# Patient Record
Sex: Male | Born: 1990 | Race: Black or African American | Hispanic: No | Marital: Single | Smoking: Heavy tobacco smoker
Health system: Southern US, Community
[De-identification: ages and names within clinical notes are randomized; demographics above are authoritative.]

---
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
## 2012-03-17 ENCOUNTER — Emergency Department: Payer: Self-pay | Admitting: Emergency Medicine

## 2012-03-24 ENCOUNTER — Emergency Department: Payer: Self-pay | Admitting: Emergency Medicine

## 2014-08-06 ENCOUNTER — Emergency Department
Admission: EM | Admit: 2014-08-06 | Discharge: 2014-08-06 | Disposition: A | Discharge status: Other Acute Inpatient Hospital | Payer: Self-pay | Attending: Emergency Medicine | Admitting: Emergency Medicine

## 2014-08-06 ENCOUNTER — Encounter: Payer: Self-pay | Admitting: Emergency Medicine

## 2014-08-06 ENCOUNTER — Emergency Department: Payer: Self-pay

## 2014-08-06 DIAGNOSIS — S02609A Fracture of mandible, unspecified, initial encounter for closed fracture: Secondary | ICD-10-CM | POA: Insufficient documentation

## 2014-08-06 DIAGNOSIS — Y9389 Activity, other specified: Secondary | ICD-10-CM | POA: Insufficient documentation

## 2014-08-06 DIAGNOSIS — Y9289 Other specified places as the place of occurrence of the external cause: Secondary | ICD-10-CM | POA: Insufficient documentation

## 2014-08-06 DIAGNOSIS — Y998 Other external cause status: Secondary | ICD-10-CM | POA: Insufficient documentation

## 2014-08-06 DIAGNOSIS — Z720 Tobacco use: Secondary | ICD-10-CM | POA: Insufficient documentation

## 2014-08-06 MED ORDER — OXYCODONE-ACETAMINOPHEN 5-325 MG PO TABS
1.0000 | ORAL_TABLET | Freq: Once | ORAL | Status: AC
  Administered 2014-08-06: 1 via ORAL
  Filled 2014-08-06: qty 1
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
## 2014-08-06 NOTE — Discharge Instructions (Signed)
No driving to [facility] due to taking Percocet prior to departure. Go to to [facility] ER, Doctor Yetta Barre is the accepting ER Doctor. Doctor Liana Gerold is the Consulting Doctor.
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
## 2014-08-06 NOTE — ED Provider Notes (Signed)
Medical screening examination/treatment/procedure(s) were conducted as a shared visit with non-physician practitioner(s) and myself.  I personally evaluated the patient during the encounter.  ----------------------------------------- 4:31 PM on 08/06/2014 -----------------------------------------  Patient is awake and alert with stable vital signs. I discussed with the patient and his family plan for transfer to [facility] for evaluation for his jaw fracture which she is agreeable with. The patient does not wish to go by ambulance though I did offer this to him, he has family who will drive him to the [facility] ER after leaving here today for further evaluation.  I did recommend to him and discussed that we certainly offer transport directly to the [facility] ER, but he and family will drive him there. He is fully awake and alert and in no distress. I've examined him personally and he has clear lungs is fully alert in no distress.  We will discharge the patient and his family stating him directly to [facility] for further evaluation regarding his jaw fracture.  Sharyn Creamer, MD 08/06/14 [phone]

## 2014-10-14 ENCOUNTER — Emergency Department: Payer: Self-pay

## 2014-10-14 ENCOUNTER — Emergency Department
Admission: EM | Admit: 2014-10-14 | Discharge: 2014-10-14 | Disposition: A | Discharge status: Other Acute Inpatient Hospital | Payer: Self-pay | Attending: Emergency Medicine | Admitting: Emergency Medicine

## 2014-10-14 ENCOUNTER — Encounter: Payer: Self-pay | Admitting: Emergency Medicine

## 2014-10-14 DIAGNOSIS — S66822A Laceration of other specified muscles, fascia and tendons at wrist and hand level, left hand, initial encounter: Secondary | ICD-10-CM | POA: Insufficient documentation

## 2014-10-14 DIAGNOSIS — Z046 Encounter for general psychiatric examination, requested by authority: Secondary | ICD-10-CM

## 2014-10-14 DIAGNOSIS — IMO0002 Reserved for concepts with insufficient information to code with codable children: Secondary | ICD-10-CM

## 2014-10-14 DIAGNOSIS — F1290 Cannabis use, unspecified, uncomplicated: Secondary | ICD-10-CM | POA: Insufficient documentation

## 2014-10-14 DIAGNOSIS — F10920 Alcohol use, unspecified with intoxication, uncomplicated: Secondary | ICD-10-CM

## 2014-10-14 DIAGNOSIS — Y9389 Activity, other specified: Secondary | ICD-10-CM | POA: Insufficient documentation

## 2014-10-14 DIAGNOSIS — Z7289 Other problems related to lifestyle: Secondary | ICD-10-CM

## 2014-10-14 DIAGNOSIS — Y998 Other external cause status: Secondary | ICD-10-CM | POA: Insufficient documentation

## 2014-10-14 DIAGNOSIS — F10120 Alcohol abuse with intoxication, uncomplicated: Secondary | ICD-10-CM | POA: Insufficient documentation

## 2014-10-14 DIAGNOSIS — X780XXA Intentional self-harm by sharp glass, initial encounter: Secondary | ICD-10-CM | POA: Insufficient documentation

## 2014-10-14 DIAGNOSIS — Z720 Tobacco use: Secondary | ICD-10-CM | POA: Insufficient documentation

## 2014-10-14 DIAGNOSIS — S61512A Laceration without foreign body of left wrist, initial encounter: Secondary | ICD-10-CM

## 2014-10-14 DIAGNOSIS — Y9289 Other specified places as the place of occurrence of the external cause: Secondary | ICD-10-CM | POA: Insufficient documentation

## 2014-10-14 LAB — CBC
HCT: 40.4 % (ref 40.0–52.0)
Hemoglobin: 13.3 g/dL (ref 13.0–18.0)
MCH: 28.8 pg (ref 26.0–34.0)
MCHC: 33 g/dL (ref 32.0–36.0)
MCV: 87.3 fL (ref 80.0–100.0)
PLATELETS: 246 10*3/uL (ref 150–440)
RBC: 4.63 MIL/uL (ref 4.40–5.90)
RDW: 14.2 % (ref 11.5–14.5)
WBC: 4.2 10*3/uL (ref 3.8–10.6)

## 2014-10-14 LAB — COMPREHENSIVE METABOLIC PANEL
ALK PHOS: 85 U/L (ref 38–126)
ALT: 16 U/L — AB (ref 17–63)
AST: 36 U/L (ref 15–41)
Albumin: 4.4 g/dL (ref 3.5–5.0)
Anion gap: 9 (ref 5–15)
BUN: 5 mg/dL — ABNORMAL LOW (ref 6–20)
CALCIUM: 8.5 mg/dL — AB (ref 8.9–10.3)
CO2: 22 mmol/L (ref 22–32)
CREATININE: 1 mg/dL (ref 0.61–1.24)
Chloride: 111 mmol/L (ref 101–111)
GFR calc Af Amer: >60 mL/min (ref >60)
GFR calc non Af Amer: >60 mL/min (ref >60)
Glucose, Bld: 104 mg/dL — ABNORMAL HIGH (ref 65–99)
Potassium: 3.9 mmol/L (ref 3.5–5.1)
SODIUM: 142 mmol/L (ref 135–145)
Total Bilirubin: 0.4 mg/dL (ref 0.3–1.2)
Total Protein: 7.5 g/dL (ref 6.5–8.1)

## 2014-10-14 LAB — ETHANOL: Alcohol, Ethyl (B): 401 mg/dL (ref <5)

## 2014-10-14 LAB — URINE DRUG SCREEN, QUALITATIVE (ARMC ONLY)
AMPHETAMINES, UR SCREEN: NOT DETECTED
BARBITURATES, UR SCREEN: NOT DETECTED
Benzodiazepine, Ur Scrn: NOT DETECTED
Cannabinoid 50 Ng, Ur ~~LOC~~: POSITIVE — AB
Cocaine Metabolite,Ur ~~LOC~~: NOT DETECTED
MDMA (Ecstasy)Ur Screen: NOT DETECTED
Methadone Scn, Ur: NOT DETECTED
Opiate, Ur Screen: NOT DETECTED
Phencyclidine (PCP) Ur S: NOT DETECTED
Tricyclic, Ur Screen: NOT DETECTED

## 2014-10-14 LAB — SALICYLATE LEVEL: Salicylate Lvl: <4 mg/dL (ref 2.8–30.0)

## 2014-10-14 LAB — ACETAMINOPHEN LEVEL

## 2014-10-14 MED ORDER — NICOTINE 10 MG IN INHA
1.0000 | RESPIRATORY_TRACT | Status: DC | PRN
  Administered 2014-10-14: 1 via RESPIRATORY_TRACT
  Filled 2014-10-14: qty 36

## 2014-10-14 MED ORDER — SODIUM CHLORIDE 0.9 % IV BOLUS (SEPSIS)
1000.0000 mL | Freq: Once | INTRAVENOUS | Status: AC
  Administered 2014-10-14: 1000 mL via INTRAVENOUS

## 2014-10-14 NOTE — ED Notes (Signed)
Pt awake and requesting to go outside and smoke a cigarette; pharmacy called for nicotrol inhaler.

## 2014-10-14 NOTE — ED Notes (Signed)
BEHAVIORAL HEALTH ROUNDING Patient sleeping: No. Patient alert and oriented: yes Behavior appropriate: Yes.  ; If no, describe:  Nutrition and fluids offered: Yes  Toileting and hygiene offered: Yes  Sitter present: yes Law enforcement present: Yes  Breakfast tray given at this time.  ENVIRONMENTAL ASSESSMENT Potentially harmful objects out of patient reach: Yes.   Personal belongings secured: Yes.   Patient dressed in hospital provided attire only: Yes.   Plastic bags out of patient reach: Yes.   Patient care equipment (cords, cables, call bells, lines, and drains) shortened, removed, or accounted for: Yes.   Equipment and supplies removed from bottom of stretcher: Yes.   Potentially toxic materials out of patient reach: Yes.   Sharps container removed or out of patient reach: Yes.    

## 2014-10-14 NOTE — ED Notes (Signed)
Pt on phone with mother at this time 

## 2014-10-14 NOTE — ED Notes (Signed)
NS moist gauze applied to cuts on left forearm, along with ABD pad and roll gauze to hold in place. Dressing placed to protect tendons unti pt is able to be transferred to [facility].
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
## 2014-10-14 NOTE — ED Notes (Signed)
Pt in room. No complaints or concerns voiced at this time. No abnormal behavior noted at this time. Will continue to monitor with q15 min checks. [employer] officer in area. 

## 2014-10-14 NOTE — ED Notes (Signed)

## 2014-10-14 NOTE — ED Notes (Signed)
Dr Dolores Frame notified of critical ETOH Level 401

## 2014-10-14 NOTE — ED Notes (Signed)
BEHAVIORAL HEALTH ROUNDING Patient sleeping: Yes.   Patient alert and oriented: not applicable Behavior appropriate: Yes.    Nutrition and fluids offered: No Toileting and hygiene offered: No Sitter present: q15 minute observations and security camera monitoring Law enforcement present: Yes [employer] 

## 2014-10-14 NOTE — ED Notes (Signed)
BEHAVIORAL HEALTH ROUNDING Patient sleeping: No. Patient alert and oriented: yes Behavior appropriate: Yes.   Nutrition and fluids offered: Yes  Toileting and hygiene offered: Yes  Sitter present: q15 min observations Law enforcement present: Yes [employer] 

## 2015-10-11 ENCOUNTER — Emergency Department
Admission: EM | Admit: 2015-10-11 | Discharge: 2015-10-11 | Disposition: A | Discharge status: Home / Self Care | Payer: Self-pay | Attending: Emergency Medicine | Admitting: Emergency Medicine

## 2015-10-11 ENCOUNTER — Encounter: Payer: Self-pay | Admitting: Emergency Medicine

## 2015-10-11 DIAGNOSIS — R197 Diarrhea, unspecified: Secondary | ICD-10-CM | POA: Insufficient documentation

## 2015-10-11 DIAGNOSIS — R109 Unspecified abdominal pain: Secondary | ICD-10-CM

## 2015-10-11 DIAGNOSIS — F17200 Nicotine dependence, unspecified, uncomplicated: Secondary | ICD-10-CM | POA: Insufficient documentation

## 2015-10-11 DIAGNOSIS — R1032 Left lower quadrant pain: Secondary | ICD-10-CM | POA: Insufficient documentation

## 2015-10-11 LAB — URINALYSIS COMPLETE WITH MICROSCOPIC (ARMC ONLY)
BILIRUBIN URINE: NEGATIVE
Bacteria, UA: NONE SEEN
GLUCOSE, UA: NEGATIVE mg/dL
HGB URINE DIPSTICK: NEGATIVE
Leukocytes, UA: NEGATIVE
Nitrite: NEGATIVE
PH: 5 (ref 5.0–8.0)
Protein, ur: NEGATIVE mg/dL
RBC / HPF: NONE SEEN RBC/hpf (ref 0–5)
SPECIFIC GRAVITY, URINE: 1.017 (ref 1.005–1.030)
Squamous Epithelial / LPF: NONE SEEN

## 2015-10-11 LAB — COMPREHENSIVE METABOLIC PANEL
ALBUMIN: 4.6 g/dL (ref 3.5–5.0)
ALK PHOS: 91 U/L (ref 38–126)
ALT: 17 U/L (ref 17–63)
AST: 30 U/L (ref 15–41)
Anion gap: 14 (ref 5–15)
BILIRUBIN TOTAL: 0.9 mg/dL (ref 0.3–1.2)
BUN: 20 mg/dL (ref 6–20)
CALCIUM: 9.4 mg/dL (ref 8.9–10.3)
CO2: 18 mmol/L — ABNORMAL LOW (ref 22–32)
Chloride: 105 mmol/L (ref 101–111)
Creatinine, Ser: 1.1 mg/dL (ref 0.61–1.24)
GFR calc Af Amer: >60 mL/min (ref >60)
GFR calc non Af Amer: >60 mL/min (ref >60)
GLUCOSE: 106 mg/dL — AB (ref 65–99)
Potassium: 4.1 mmol/L (ref 3.5–5.1)
SODIUM: 137 mmol/L (ref 135–145)
TOTAL PROTEIN: 8.3 g/dL — AB (ref 6.5–8.1)

## 2015-10-11 LAB — CBC
HCT: 44.1 % (ref 40.0–52.0)
HEMOGLOBIN: 15.1 g/dL (ref 13.0–18.0)
MCH: 30 pg (ref 26.0–34.0)
MCHC: 34.1 g/dL (ref 32.0–36.0)
MCV: 87.8 fL (ref 80.0–100.0)
Platelets: 249 10*3/uL (ref 150–440)
RBC: 5.02 MIL/uL (ref 4.40–5.90)
RDW: 14.5 % (ref 11.5–14.5)
WBC: 5.9 10*3/uL (ref 3.8–10.6)

## 2015-10-11 LAB — LIPASE, BLOOD: Lipase: 24 U/L (ref 11–51)

## 2015-10-11 MED ORDER — DIPHENOXYLATE-ATROPINE 2.5-0.025 MG PO TABS
1.0000 | ORAL_TABLET | Freq: Once | ORAL | Status: AC
  Administered 2015-10-11: 1 via ORAL

## 2015-10-11 MED ORDER — LOPERAMIDE HCL 2 MG PO TABS: 2.0000 mg | ORAL_TABLET | Freq: Four times a day (QID) | ORAL | 0 refills | Status: DC | PRN

## 2015-10-11 NOTE — ED Triage Notes (Signed)
abd pain and diarrhea started last night.

## 2015-10-11 NOTE — Discharge Instructions (Signed)
You may follow up bland BRAT diet until your diarrhea has completely gone away. You may take loperamide for diarrhea. Nausea has frequently to prevent the spread of illness.  Return to the emergency department if you develop worsening abdominal pain, fever, inability to keep down fluids, lightheadedness or fainting, or any other symptoms concerning to you.

## 2015-10-11 NOTE — ED Provider Notes (Signed)
[facility] Emergency Department Provider Note  ____________________________________________  Time seen: Approximately 4:15 PM  I have reviewed the triage vital signs and the nursing notes.   HISTORY  Chief Complaint Diarrhea and Abdominal Pain    HPI Jordan Marshall is a 25 y.o. male , otherwise healthy, presenting with abdominal cramping and diarrhea. The patient reports he has a close friend with similar symptoms. Since yesterday, he has had abdominal cramping worse in the left lower quadrant associated with multiple episodes of loose stool. He has not had any nausea or vomiting, no fever, no testicular or scrotal pain. 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 No recent travel outside the [place].   History reviewed. No pertinent past medical history.  There are no active problems to display for this patient.   History reviewed. No pertinent surgical history.    Allergies Review of patient's allergies indicates no known allergies.  No family history on file.  Social History Social History  Substance Use Topics  . Smoking status: Heavy Tobacco Smoker    Packs/day: 1.00  . Smokeless tobacco: Never Used  . Alcohol use Yes    Review of Systems Constitutional: No fever/chills.No lightheadedness or syncope. Eyes: No visual changes. ENT: No sore throat. No congestion or rhinorrhea. Cardiovascular: Denies chest pain. Denies palpitations. Respiratory: Denies shortness of breath.  No cough. Gastrointestinal: Positive abdominal pain.  No nausea, no vomiting.  fdiarrhea.  No constipation. Genitourinary: Negative for dysuria. Musculoskeletal: Negative for back pain. Skin: Negative for rash. Neurological: Negative for headaches. No focal numbness, tingling or weakness.   10-point ROS otherwise negative.  ____________________________________________   PHYSICAL EXAM:  VITAL SIGNS: ED Triage Vitals [10/11/15 1536]  Enc Vitals Group     BP 124/70      Pulse Rate 88     Resp 18     Temp 97.6 F (36.4 C)     Temp Source Oral     SpO2 100 %     Weight 150 lb (68 kg)     Height 5\' 5"  (1.651 m)     Head Circumference      Peak Flow      Pain Score 5     Pain Loc      Pain Edu?      Excl. in GC?     Constitutional: Alert and oriented. Well appearing and in no acute distress. Answers questions appropriately. Eyes: Conjunctivae are normal.  EOMI. No scleral icterus. Head: Atraumatic. Nose: No congestion/rhinnorhea. Mouth/Throat: Mucous membranes are moist.  Neck: No stridor.  Supple.   Cardiovascular: Normal rate, regular rhythm. No murmurs, rubs or gallops.  Respiratory: Normal respiratory effort.  No accessory muscle use or retractions. Lungs CTAB.  No wheezes, rales or ronchi. Gastrointestinal: Soft and nondistended. Minimal tenderness to palpation in left lower quadrant. No guarding or rebound.  No peritoneal signs. Musculoskeletal: No LE edema. . Neurologic:  A&Ox3.  Speech is clear.  Face and smile are symmetric.  EOMI.  Moves all extremities well. Skin:  Skin is warm, dry and intact. No rash noted. Psychiatric: Mood and affect are normal. Speech and behavior are normal.  Normal judgement.  ____________________________________________   LABS (all labs ordered are listed, but only abnormal results are displayed)  Labs Reviewed  COMPREHENSIVE METABOLIC PANEL - Abnormal; Notable for the following:       Result Value   CO2 18 (*)    Glucose, Bld 106 (*)    Total Protein 8.3 (*)  All other components within normal limits  URINALYSIS COMPLETEWITH MICROSCOPIC ([facility] ONLY) - Abnormal; Notable for the following:    Color, Urine YELLOW (*)    APPearance CLEAR (*)    Ketones, ur 2+ (*)    All other components within normal limits  LIPASE, BLOOD  CBC   ____________________________________________  EKG  Not indicated ____________________________________________  RADIOLOGY  No results  found.  ____________________________________________   PROCEDURES  Procedure(s) performed: None  Procedures  Critical Care performed: No ____________________________________________   INITIAL IMPRESSION / ASSESSMENT AND PLAN / ED COURSE  Pertinent labs & imaging results that were available during my care of the patient were reviewed by me and considered in my medical decision making (see chart for details).  25 y.o. male, otherwise healthy, with a recent sick contact presenting with abdominal cramping and diarrhea. Overall, the patient is well-appearing with stable vital signs. He has a white blood cell count which is normal and the remainder of his labs are reassuring. Examination is reassuring with some minimal tenderness in the left lower quadrant. The most likely etiology is a viral GI illness. I would consider early diverticulitis, no [place] the patient about return precautions both for diverticulitis and early appy. At this time, there is no indication for abdominal imaging, but I will treat him symptomatically. He will follow-up with her primary care physician. He understands return precautions as well as follow-up instructions.  ____________________________________________  FINAL CLINICAL IMPRESSION(S) / ED DIAGNOSES  Final diagnoses:  Abdominal cramping  Diarrhea, unspecified type    Clinical Course      NEW MEDICATIONS STARTED DURING THIS VISIT:  New Prescriptions   LOPERAMIDE (IMODIUM A-D) 2 MG TABLET    Take 1 tablet (2 mg total) by mouth 4 (four) times daily as needed for diarrhea or loose stools.      Rockne Menghini, MD 10/11/15 [phone]

## 2015-10-14 ENCOUNTER — Emergency Department: Payer: Self-pay

## 2015-10-14 ENCOUNTER — Emergency Department
Admission: EM | Admit: 2015-10-14 | Discharge: 2015-10-15 | Disposition: A | Discharge status: Home / Self Care | Payer: Self-pay | Attending: Emergency Medicine | Admitting: Emergency Medicine

## 2015-10-14 DIAGNOSIS — S60419A Abrasion of unspecified finger, initial encounter: Secondary | ICD-10-CM | POA: Insufficient documentation

## 2015-10-14 DIAGNOSIS — Y929 Unspecified place or not applicable: Secondary | ICD-10-CM | POA: Insufficient documentation

## 2015-10-14 DIAGNOSIS — S0990XA Unspecified injury of head, initial encounter: Secondary | ICD-10-CM

## 2015-10-14 DIAGNOSIS — Y9389 Activity, other specified: Secondary | ICD-10-CM | POA: Insufficient documentation

## 2015-10-14 DIAGNOSIS — Y999 Unspecified external cause status: Secondary | ICD-10-CM | POA: Insufficient documentation

## 2015-10-14 DIAGNOSIS — F17200 Nicotine dependence, unspecified, uncomplicated: Secondary | ICD-10-CM | POA: Insufficient documentation

## 2015-10-14 DIAGNOSIS — S0003XA Contusion of scalp, initial encounter: Secondary | ICD-10-CM | POA: Insufficient documentation

## 2015-10-14 DIAGNOSIS — T07XXXA Unspecified multiple injuries, initial encounter: Secondary | ICD-10-CM

## 2015-10-14 DIAGNOSIS — S0083XA Contusion of other part of head, initial encounter: Secondary | ICD-10-CM | POA: Insufficient documentation
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
## 2015-10-15 MED ORDER — AMOXICILLIN-POT CLAVULANATE 875-125 MG PO TABS
1.0000 | ORAL_TABLET | Freq: Once | ORAL | Status: AC
  Administered 2015-10-15: 1 via ORAL
  Filled 2015-10-15: qty 1

## 2015-10-15 MED ORDER — AMOXICILLIN-POT CLAVULANATE 875-125 MG PO TABS: 1.0000 | ORAL_TABLET | Freq: Two times a day (BID) | ORAL | 0 refills | Status: AC

## 2015-10-15 NOTE — ED Notes (Signed)
Patient transported to CT 
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
## 2015-10-15 NOTE — ED Notes (Signed)
Pt. Going home with mother. 
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
## 2017-04-11 ENCOUNTER — Encounter: Payer: Self-pay | Admitting: Emergency Medicine

## 2017-04-11 ENCOUNTER — Emergency Department: Payer: Self-pay

## 2017-04-11 ENCOUNTER — Emergency Department
Admission: EM | Admit: 2017-04-11 | Discharge: 2017-04-11 | Disposition: A | Discharge status: Home / Self Care | Payer: Self-pay | Attending: Emergency Medicine | Admitting: Emergency Medicine

## 2017-04-11 DIAGNOSIS — F17200 Nicotine dependence, unspecified, uncomplicated: Secondary | ICD-10-CM | POA: Insufficient documentation

## 2017-04-11 DIAGNOSIS — Y9389 Activity, other specified: Secondary | ICD-10-CM | POA: Insufficient documentation

## 2017-04-11 DIAGNOSIS — M79602 Pain in left arm: Secondary | ICD-10-CM

## 2017-04-11 DIAGNOSIS — Y929 Unspecified place or not applicable: Secondary | ICD-10-CM | POA: Insufficient documentation

## 2017-04-11 DIAGNOSIS — Y999 Unspecified external cause status: Secondary | ICD-10-CM | POA: Insufficient documentation

## 2017-04-11 DIAGNOSIS — S5002XA Contusion of left elbow, initial encounter: Secondary | ICD-10-CM | POA: Insufficient documentation

## 2017-04-11 MED ORDER — NAPROXEN 500 MG PO TABS
500.0000 mg | ORAL_TABLET | Freq: Once | ORAL | Status: AC
  Administered 2017-04-11: 500 mg via ORAL
  Filled 2017-04-11: qty 1

## 2017-04-11 MED ORDER — TRAMADOL HCL 50 MG PO TABS: 50.0000 mg | ORAL_TABLET | Freq: Two times a day (BID) | ORAL | 0 refills | Status: DC | PRN

## 2017-04-11 MED ORDER — TRAMADOL HCL 50 MG PO TABS
50.0000 mg | ORAL_TABLET | Freq: Once | ORAL | Status: AC
  Administered 2017-04-11: 50 mg via ORAL
  Filled 2017-04-11: qty 1

## 2017-04-11 MED ORDER — NAPROXEN 500 MG PO TABS: 500.0000 mg | ORAL_TABLET | Freq: Two times a day (BID) | ORAL | Status: DC
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
## 2017-04-11 NOTE — Discharge Instructions (Signed)
Arm sling for 3-5 days as needed. °
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
## 2018-04-04 ENCOUNTER — Other Ambulatory Visit: Payer: Self-pay

## 2018-04-04 ENCOUNTER — Emergency Department
Admission: EM | Admit: 2018-04-04 | Discharge: 2018-04-05 | Disposition: A | Discharge status: Home / Self Care | Payer: Self-pay | Attending: Emergency Medicine | Admitting: Emergency Medicine

## 2018-04-04 ENCOUNTER — Emergency Department: Payer: Self-pay

## 2018-04-04 DIAGNOSIS — F10929 Alcohol use, unspecified with intoxication, unspecified: Secondary | ICD-10-CM | POA: Insufficient documentation

## 2018-04-04 LAB — COMPREHENSIVE METABOLIC PANEL
ALT: 15 U/L (ref 0–44)
AST: 33 U/L (ref 15–41)
Albumin: 4.7 g/dL (ref 3.5–5.0)
Alkaline Phosphatase: 95 U/L (ref 38–126)
Anion gap: 13 (ref 5–15)
BUN: 9 mg/dL (ref 6–20)
CO2: 19 mmol/L — ABNORMAL LOW (ref 22–32)
Calcium: 9 mg/dL (ref 8.9–10.3)
Chloride: 110 mmol/L (ref 98–111)
Creatinine, Ser: 0.72 mg/dL (ref 0.61–1.24)
GFR calc Af Amer: >60 mL/min (ref >60)
Glucose, Bld: 112 mg/dL — ABNORMAL HIGH (ref 70–99)
Potassium: 3.7 mmol/L (ref 3.5–5.1)
Sodium: 142 mmol/L (ref 135–145)
Total Bilirubin: 0.4 mg/dL (ref 0.3–1.2)
Total Protein: 9 g/dL — ABNORMAL HIGH (ref 6.5–8.1)

## 2018-04-04 LAB — CBC WITH DIFFERENTIAL/PLATELET
Abs Immature Granulocytes: 0.03 10*3/uL (ref 0.00–0.07)
Basophils Absolute: 0 10*3/uL (ref 0.0–0.1)
Basophils Relative: 0 %
Eosinophils Absolute: 0.1 10*3/uL (ref 0.0–0.5)
Eosinophils Relative: 1 %
HCT: 45.5 % (ref 39.0–52.0)
Hemoglobin: 15 g/dL (ref 13.0–17.0)
Immature Granulocytes: 0 %
Lymphocytes Relative: 54 %
Lymphs Abs: 4.8 10*3/uL — ABNORMAL HIGH (ref 0.7–4.0)
MCH: 29 pg (ref 26.0–34.0)
MCHC: 33 g/dL (ref 30.0–36.0)
MCV: 88 fL (ref 80.0–100.0)
MONO ABS: 0.6 10*3/uL (ref 0.1–1.0)
MONOS PCT: 7 %
NEUTROS ABS: 3.4 10*3/uL (ref 1.7–7.7)
NEUTROS PCT: 38 %
Platelets: 278 10*3/uL (ref 150–400)
RBC: 5.17 MIL/uL (ref 4.22–5.81)
RDW: 12.9 % (ref 11.5–15.5)
WBC: 8.9 10*3/uL (ref 4.0–10.5)
nRBC: 0 % (ref 0.0–0.2)

## 2018-04-04 LAB — ETHANOL: Alcohol, Ethyl (B): >600 mg/dL (ref <10)

## 2018-04-04 LAB — LIPASE, BLOOD: Lipase: 57 U/L — ABNORMAL HIGH (ref 11–51)

## 2018-04-04 NOTE — ED Notes (Signed)
Pt attempting to get out of bed, cursing at staff. Pt is redirectable.
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
## 2018-04-04 NOTE — Discharge Instructions (Signed)
Please seek help with your drinking disorder. Please seek medical attention for any high fevers, chest pain, shortness of breath, change in behavior, persistent vomiting, bloody stool or any other new or concerning symptoms.

## 2018-04-04 NOTE — ED Notes (Signed)
Pt urinated on self. Pt and sheets changed. Condom cath placed on pt with drainage bag. Pt rolled onto right side with pillow support to reduce aspiration risk if pt vomits. Pt arouses to states "I hate you" while being cleansed and repositioned. resps unlabored.

## 2018-04-04 NOTE — ED Notes (Signed)
Pt is awake and talking to himself in nonsensical speech.  Pt has taken his pulse ox off.

## 2018-04-04 NOTE — ED Notes (Signed)
Pt sleeping, remains on right side. resps unlabored.

## 2018-04-04 NOTE — ED Notes (Signed)
Critical etoh from lab of greater than 600 called from lab. Dr. Derrill Kay notified.
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
## 2018-04-05 LAB — URINE DRUG SCREEN, QUALITATIVE (ARMC ONLY)
Amphetamines, Ur Screen: NOT DETECTED
BENZODIAZEPINE, UR SCRN: NOT DETECTED
Barbiturates, Ur Screen: NOT DETECTED
Cannabinoid 50 Ng, Ur ~~LOC~~: NOT DETECTED
Cocaine Metabolite,Ur ~~LOC~~: NOT DETECTED
MDMA (Ecstasy)Ur Screen: NOT DETECTED
Methadone Scn, Ur: NOT DETECTED
Opiate, Ur Screen: NOT DETECTED
Phencyclidine (PCP) Ur S: NOT DETECTED
Tricyclic, Ur Screen: NOT DETECTED

## 2018-04-05 LAB — ETHANOL: ALCOHOL ETHYL (B): 447 mg/dL — AB (ref <10)

## 2018-04-05 NOTE — ED Notes (Addendum)
Pt sitting up on side of bed with sitter at bedside. Pt continues to refuse to leave monitoring equipment in place.

## 2018-04-05 NOTE — ED Notes (Addendum)
Pt ambulatory around room without difficulty. Pt is alert and oriented x4 currently. Speech clear. Pt has consumed meal tray and po fluids without difficulty.

## 2018-04-05 NOTE — ED Notes (Signed)
Pt sitting up in bed in no acute distress, talking with sitter.

## 2018-04-05 NOTE — ED Notes (Signed)
MD notified of pt's blood alcohol of 447.

## 2018-04-05 NOTE — ED Notes (Signed)
Pt provided with po fluids. Sitter at bedside.

## 2018-04-05 NOTE — ED Notes (Signed)
Mother is here to take pt home.

## 2018-04-05 NOTE — ED Notes (Addendum)
Patient agitated requesting to go home, sitter assured patient once he has been sober awhile he may be medically cleared.AS

## 2018-04-05 NOTE — ED Notes (Signed)
Pt awake, alert, talking with sitter.

## 2018-04-05 NOTE — ED Notes (Signed)
Patient alert, talking and calm. Gave patient water.AS

## 2018-04-05 NOTE — ED Notes (Signed)

## 2018-04-05 NOTE — ED Notes (Signed)
Pt sitting up in bed, conversing with staff. Speech is still slurred at times.

## 2018-04-06 ENCOUNTER — Encounter: Payer: Self-pay | Admitting: Emergency Medicine

## 2018-07-06 ENCOUNTER — Emergency Department
Admission: EM | Admit: 2018-07-06 | Discharge: 2018-07-06 | Disposition: A | Discharge status: Home / Self Care | Payer: Self-pay | Attending: Emergency Medicine | Admitting: Emergency Medicine

## 2018-07-06 ENCOUNTER — Other Ambulatory Visit: Payer: Self-pay

## 2018-07-06 DIAGNOSIS — S61411A Laceration without foreign body of right hand, initial encounter: Secondary | ICD-10-CM | POA: Insufficient documentation

## 2018-07-06 DIAGNOSIS — W260XXA Contact with knife, initial encounter: Secondary | ICD-10-CM | POA: Diagnosis not present

## 2018-07-06 DIAGNOSIS — S6991XA Unspecified injury of right wrist, hand and finger(s), initial encounter: Secondary | ICD-10-CM | POA: Diagnosis present

## 2018-07-06 DIAGNOSIS — Y929 Unspecified place or not applicable: Secondary | ICD-10-CM | POA: Insufficient documentation

## 2018-07-06 DIAGNOSIS — Y998 Other external cause status: Secondary | ICD-10-CM | POA: Diagnosis not present

## 2018-07-06 DIAGNOSIS — Y939 Activity, unspecified: Secondary | ICD-10-CM | POA: Diagnosis not present

## 2018-07-06 DIAGNOSIS — F17210 Nicotine dependence, cigarettes, uncomplicated: Secondary | ICD-10-CM | POA: Insufficient documentation

## 2018-07-06 MED ORDER — NAPROXEN 500 MG PO TABS: 500.0000 mg | ORAL_TABLET | Freq: Two times a day (BID) | ORAL | Status: DC

## 2018-07-06 MED ORDER — LIDOCAINE HCL (PF) 1 % IJ SOLN
INTRAMUSCULAR | Status: AC
  Administered 2018-07-06: 5 mL
  Filled 2018-07-06: qty 5

## 2018-07-06 MED ORDER — LIDOCAINE HCL (PF) 1 % IJ SOLN
5.0000 mL | Freq: Once | INTRAMUSCULAR | Status: AC
  Administered 2018-07-06: 5 mL
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
## 2023-02-25 ENCOUNTER — Other Ambulatory Visit: Payer: Self-pay

## 2023-02-25 ENCOUNTER — Emergency Department
Admission: EM | Admit: 2023-02-25 | Discharge: 2023-02-25 | Disposition: A | Discharge status: Home / Self Care | Payer: 59 | Attending: Emergency Medicine | Admitting: Emergency Medicine

## 2023-02-25 DIAGNOSIS — K6289 Other specified diseases of anus and rectum: Secondary | ICD-10-CM | POA: Diagnosis present

## 2023-02-25 DIAGNOSIS — K644 Residual hemorrhoidal skin tags: Secondary | ICD-10-CM | POA: Diagnosis not present

## 2023-02-25 MED ORDER — PHENYLEPHRINE-MINERAL OIL-PET 0.25-14-74.9 % RE OINT: 1.0000 | TOPICAL_OINTMENT | Freq: Two times a day (BID) | RECTAL | 1 refills | Status: AC | PRN

## 2023-02-25 MED ORDER — LIDOCAINE (ANORECTAL) 5 % EX CREA: 1.0000 | TOPICAL_CREAM | Freq: Four times a day (QID) | CUTANEOUS | 0 refills | Status: AC | PRN

## 2023-02-25 NOTE — ED Triage Notes (Signed)
Pt comes with c/o hemorrhoids the patient states he was here last night but wait was too long but left.

## 2023-02-25 NOTE — ED Notes (Signed)
See triage note  Presents with rectal pain   States he has hemorrhoids   States pain is worse

## 2023-02-25 NOTE — ED Provider Notes (Signed)
[facility] Provider Note    Event Date/Time   First MD Initiated Contact with Patient 02/25/23 1026     (approximate)   History   Hemorrhoids   HPI  Jordan Marshall is a 33 y.o. male reports no major medical problems  Around Sunday started noticing pain around his rectum.  Feels like he may have a hemorrhoid.  He has never had 1 before but the area is swollen, sore to the touch.  Difficult for him to sit on his buttock, relieved by laying on his stomach sitting on his side.  Denies history of constipation or difficulty stooling.  No abdominal pain eating and drinking normally.  Reports it is just very painful to sit on   His mother also reports he had a slight dry cough for about a 1 month, patient reports that he had a cold a couple weeks ago and he still has just a slight cough removed but he is getting much better.     Physical Exam   Triage Vital Signs: ED Triage Vitals  Encounter Vitals Group     BP 02/25/23 0942 (!) 132/91     Systolic BP Percentile --      Diastolic BP Percentile --      Pulse Rate 02/25/23 0942 100     Resp 02/25/23 0942 18     Temp 02/25/23 0942 98 F (36.7 C)     Temp src --      SpO2 02/25/23 0942 97 %     Weight --      Height --      Head Circumference --      Peak Flow --      Pain Score 02/25/23 0941 10     Pain Loc --      Pain Education --      Exclude from Growth Chart --     Most recent vital signs: Vitals:   02/25/23 0942  BP: (!) 132/91  Pulse: 100  Resp: 18  Temp: 98 F (36.7 C)  SpO2: 97%     General: Awake, no distress.  CV:  Good peripheral perfusion.  Normal tones Resp:  Normal effort.  Clear bilaterally.  Occasional slight dry cough.  Work of breathing is normal lung sounds are clear no wheezing. Abd:  No distention.  Nontender Other:  Normal perineum.  He has a approximately 2 cm fairly indurated area at approximately the 9 o'clock position of the rectum that demonstrates  what appears to be an external hemorrhoid.  There is no active bleeding.  Otherwise the perirectal region and gluteal cleft is normal.  There is no abscess.  Patient reports the area of these 2 cm swelling is sore to touch.  There is no surrounding cellulitic changes   ED Results / Procedures / Treatments   Labs (all labs ordered are listed, but only abnormal results are displayed) Labs Reviewed - No data to display   EKG     RADIOLOGY     PROCEDURES:  Critical Care performed: No  Procedures   MEDICATIONS ORDERED IN ED: Medications - No data to display   IMPRESSION / MDM / ASSESSMENT AND PLAN / ED COURSE  I reviewed the triage vital signs and the nursing notes.                              Differential diagnosis includes, but is not limited to,  internal or external hemorrhoid, perirectal abscess, cyst, etc.  Based on clinical examination history appears to represent a likely thrombosed external hemorrhoid.  Discussed with patient and recommended he follow-up with surgery for consideration as to whether he may need excision banding or further procedural management.  At this time we will treat with lidocaine cream and Preparation H.  Patient and his mother are agreeable with this plan will be contacting general surgery today for follow-up  No evidence of acute complication.  Denies history of constipation or difficulty stooling.  No associated GI symptoms  Patient's presentation is most consistent with acute, uncomplicated illness.   Also regarding his cough, he has had a cough for a couple weeks time reports it is improving his lung sounds are clear oxygen saturation is normal work of breathing normal.  Suspect likely slight postviral cough at this point.  Discussed return precautions if this cough is worsening difficulty breathing fevers etc. can return and have further evaluated which patient agrees with  Return precautions and treatment recommendations and follow-up  discussed with the patient who is agreeable with the plan.        FINAL CLINICAL IMPRESSION(S) / ED DIAGNOSES   Final diagnoses:  External hemorrhoid     Rx / DC Orders   ED Discharge Orders          Ordered    phenylephrine-shark liver oil-mineral oil-petrolatum (PREPARATION H) 0.25-14-74.9 % rectal ointment  2 times daily PRN        02/25/23 1045    Lidocaine, Anorectal, (RECTICARE) 5 % CREA  Every 6 hours PRN        02/25/23 1045             Note:  This document was prepared using Dragon voice recognition software and may include unintentional dictation errors.   Sharyn Creamer, MD 02/25/23 1049

## 2023-02-27 ENCOUNTER — Ambulatory Visit (INDEPENDENT_AMBULATORY_CARE_PROVIDER_SITE_OTHER): Payer: MEDICAID | Admitting: General Surgery

## 2023-02-27 ENCOUNTER — Encounter: Payer: Self-pay | Admitting: General Surgery

## 2023-02-27 VITALS — BP 134/96 | HR 108 | Temp 98.0°F | Ht 65.0 in | Wt 139.0 lb

## 2023-02-27 DIAGNOSIS — K645 Perianal venous thrombosis: Secondary | ICD-10-CM | POA: Diagnosis not present

## 2023-02-27 NOTE — Patient Instructions (Addendum)
Use the Recticare every 6 hours for pain control and to reduce the inflammation of the hemorrhoid. Take warm tub soaks 1-2 times a day especially after bowel movements.  Keep your bowel movements easy and soft by taking stool softeners like Colace, this is over the counter.   Follow up here in 4 weeks.   Advised to pursue a goal of 25 to 30 g of fiber daily.  The majority of this may be through natural sources, advised to ensure a minimal daily fiber supplementation.  Various forms of supplements discussed.  Recommend Psyllium husk, with options of mixing with beverage or applesauce to make more tolerable. Strongly advised to consume more fluids(especially in proximity to fiber intake) and to ensure adequate hydration.   Watch color of urine to determine adequacy of hydration.  Clarity is pursued in urine output, and bowel activity that responds and corresponds to significant meal intake.   We need to avoid deferring having bowel movements, advised to take the time at the first sign of sensation, typically following meals, and in the morning.  The need to avoid more frequently, and the presence of flatus may indicate the need for bowel movement.  Do not defer for later.  Addition of MiraLAX (or its generic equivalent) may be needed ensure at least twice daily bowel movements.  If multiple doses of MiraLAX are necessary utilize them. Never skip a day... Do not tolerate a day without a bowel movement unless you are fasting.  To be regular, we must do the above EVERY day.   Soluble Fiber Dissolves in Water: Soluble fiber dissolves in water to form a gel-like substance. Slows Digestion: This type of fiber slows down digestion, which can help control blood sugar levels and lower cholesterol. Sources: Common sources include oats, beans, apples, citrus fruits, and psyllium. Benefits: Helps manage cholesterol levels. Aids in blood sugar control. Increases healthy gut bacteria, which can lower  inflammation and improve digestion.  Insoluble Fiber Does Not Dissolve in Water: Insoluble fiber does not dissolve in water and remains mostly intact as it passes through the digestive system. Adds Bulk to Stool: It adds bulk to stool, which helps promote regular bowel movements and prevent constipation. Sources: Common sources include whole grains, nuts, beans, and vegetables like cauliflower and potatoes. Benefits: Improves bowel health and regularity. Reduces the risk of colorectal conditions like hemorrhoids and diverticulitis. Supports insulin sensitivity in people with diabetes.  Both types of fiber are essential for overall health, and it's beneficial to include a 50/50 mix of both in your diet.    Hemorrhoids Hemorrhoids are swollen veins that may form: In the butt (rectum). These are called internal hemorrhoids. Around the opening of the butt (anus). These are called external hemorrhoids. Most hemorrhoids do not cause very bad problems. They often get better with changes to your lifestyle and what you eat. What are the causes? Having trouble pooping (constipation) or watery poop (diarrhea). Pushing too hard when you poop. Pregnancy. Being very overweight (obese). Sitting for too long. Riding a bike for a long time. Heavy lifting or other things that take a lot of effort. Anal sex. What are the signs or symptoms? Pain. Itching or soreness in the butt. Bleeding from the butt. Leaking poop. Swelling. One or more lumps around the opening of your butt. How is this treated? In most cases, hemorrhoids can be treated at home. You may be told to: Change what you eat. Make changes to your lifestyle. If these treatments do not  help, you may need to have a procedure done. Your doctor may need to: Place rubber bands at the bottom of the hemorrhoids to make them fall off. Put medicine into the hemorrhoids to shrink them. Shine a type of light on the hemorrhoids to cause them to  fall off. Do surgery to get rid of the hemorrhoids. Follow these instructions at home: Medicines Take over-the-counter and prescription medicines only as told by your doctor. Use creams with medicine in them or medicines that you put in your butt as told by your doctor. Eating and drinking  Eat foods that have a lot of fiber in them. These include whole grains, beans, nuts, fruits, and vegetables. Ask your doctor about taking products that have fiber added to them (fibersupplements). Take in less fat. You can do this by: Eating low-fat dairy products. Eating less red meat. Staying away from processed foods. Drink enough fluid to keep your pee (urine) pale yellow. Managing pain and swelling  Take a warm-water bath (sitz bath) for 20 minutes to ease pain. Do this 3-4 times a day. You may do this in a bathtub. You may also use a portable sitz bath that fits over the toilet. If told, put ice on the painful area. It may help to use ice between your warm baths. Put ice in a plastic bag. Place a towel between your skin and the bag. Leave the ice on for 20 minutes, 2-3 times a day. If your skin turns bright red, take off the ice right away to prevent skin damage. The risk of damage is higher if you cannot feel pain, heat, or cold. General instructions Exercise. Ask your doctor how much and what kind of exercise is best for you. Go to the bathroom when you need to poop. Do not wait. Try not to push too hard when you poop. Keep your butt dry and clean. Use wet toilet paper or moist towelettes after you poop. Do not sit on the toilet for a long time. Contact a doctor if: You have pain and swelling that do not get better with treatment. You have trouble pooping. You cannot poop. You have pain or swelling outside the area of the hemorrhoids. Get help right away if: You have bleeding from the butt that will not stop. This information is not intended to replace advice given to you by your health  care provider. Make sure you discuss any questions you have with your health care provider. Document Revised: 09/12/2021 Document Reviewed: 09/12/2021 Elsevier Patient Education  2024 [employer].

## 2023-02-27 NOTE — Progress Notes (Signed)
Patient ID: Jordan Marshall, male   DOB: Apr 20, 1990, 33 y.o.   MRN: [id number] CC: Thrombosed Hemorrhoid History of Present Illness Jordan Marshall is a 33 y.o. male with no significant past medical history who presents in consultation for thrombosed hemorrhoid.  He says that he started to have pain in his rectum and anus approximately 4 days ago.  He says that this was preceded by a cough.  He reports that he has sharp pain with swelling in the area.  He denies any bleeding with his bowel movements.  He does feel tissue that is protruding from the area.  He denies any blood in his stool.  He reports that prior to this he was having approximately 1 bowel movement per day and it was soft.  He was not having to strain at all.  He has not been using any creams or sitz bath's for the pain but has been using Tylenol.  He has no family history of colon cancer and has never had a colonoscopy..  Past Medical History No past medical history on file.     No past surgical history on file.  No Known Allergies  Current Outpatient Medications  Medication Sig Dispense Refill   acetaminophen (TYLENOL) 500 MG tablet Take 500 mg by mouth every 6 (six) hours as needed.     Lidocaine, Anorectal, (RECTICARE) 5 % CREA Apply 1 Application topically every 6 (six) hours as needed (pain at hemorrhoid). (Patient not taking: Reported on 02/27/2023) 30 g 0   phenylephrine-shark liver oil-mineral oil-petrolatum (PREPARATION H) 0.25-14-74.9 % rectal ointment Place 1 Application rectally 2 (two) times daily as needed for hemorrhoids. (Patient not taking: Reported on 02/27/2023) 28 g 1   No current facility-administered medications for this visit.    Family History No family history on file.     Social History Social History   Tobacco Use   Smoking status: Heavy Smoker    Passive exposure: Past   Smokeless tobacco: Never  Vaping Use   Vaping status: Never Used  Substance Use Topics   Alcohol use: Yes     Reports drinking 2 to 3 40 ounce beers per day and smoking about a half a pack per day of cigarettes.   ROS Full ROS of systems performed and is otherwise negative there than what is stated in the HPI  Physical Exam Blood pressure (!) 134/96, pulse (!) 108, temperature 98 F (36.7 C), height 5\' 5"  (1.651 m), weight 139 lb (63 kg), SpO2 99%.  Alert and oriented x 3, normal work of breathing on room air, regular rate and rhythm, moving all extremities spontaneously, mood and affect appropriate, rectal exam performed in the presence of a chaperone.  He has a thrombosed hemorrhoid on the left side with obvious clot in it.  It is painful to touch.  There is no active bleeding or purulence.  Data Reviewed ED visit reviewed and at that time he was noted to have a thrombosed hemorrhoid but no attempt at lancing it was made.  I have personally reviewed the patient's imaging and medical records.    Assessment/Plan    33 year old with no significant past medical history that presents in consultation for thrombosed hemorrhoid.  I discussed with him that given that he has had this for 4 days there is no role of lancing it.  I discussed that the treatment now is symptomatic.  We gave him some lidocaine jelly to use on the thrombosed hemorrhoid.  I also recommended as  needed sitz bath's.  He seems to have good bowel movement habits so I have recommended continuing to maintain those plus fiber supplementation.  He can use over-the-counter pain meds as needed.  He should follow-up with [place] in 4 weeks to ensure everything is healed    Jordan Marshall 02/27/2023, 10:34 AM

## 2023-03-27 ENCOUNTER — Ambulatory Visit: Payer: MEDICAID | Admitting: General Surgery
